# Patient Record
Sex: Female | Born: 2000 | Race: White | Hispanic: No | Marital: Single | Smoking: Never smoker
Health system: Southern US, Community
[De-identification: ages and names within clinical notes are randomized; demographics above are authoritative.]

## PROBLEM LIST (undated history)

## (undated) DIAGNOSIS — Q677 Pectus carinatum: Secondary | ICD-10-CM

## (undated) HISTORY — DX: Pectus carinatum: Q67.7

---
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
## 2022-10-18 IMAGING — US US PELVIS COMPLETE
1 series · 14 of 25 positions shown · non-contrast
Comparison: None

CLINICAL DATA: Polycystic ovarian syndrome, LMP 12/03/2020

EXAM:
TRANSABDOMINAL ULTRASOUND OF PELVIS
TECHNIQUE: Transabdominal ultrasound examination of the pelvis was performed
including evaluation of the uterus, ovaries, adnexal regions, and
pelvic cul-de-sac.

[Series 1: us pelvis complete · 0.14mm/px · 14 of 36 slices shown]
[im 1/36]
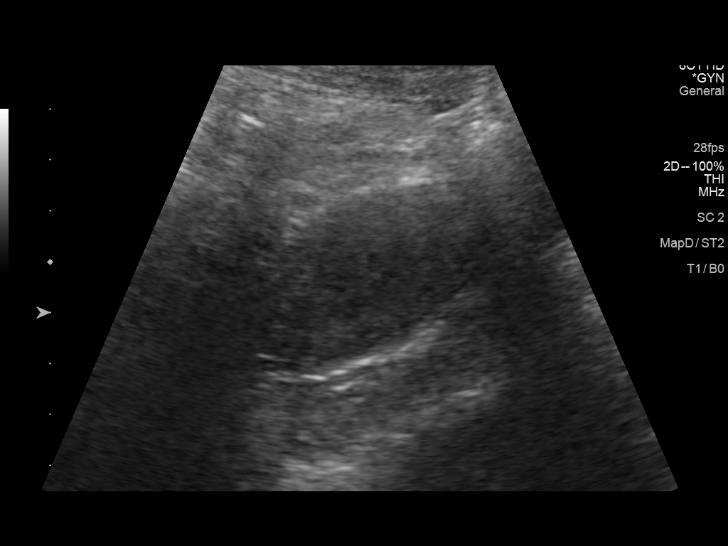
[im 3/36]
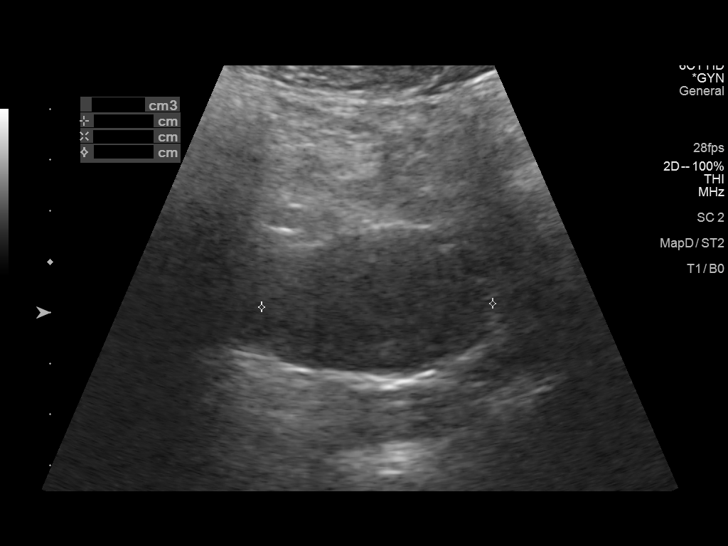
[im 6/36]
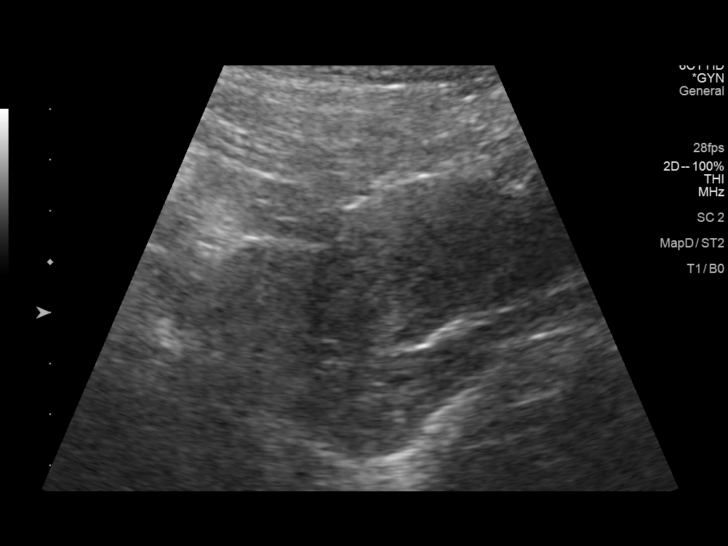
[im 9/36]
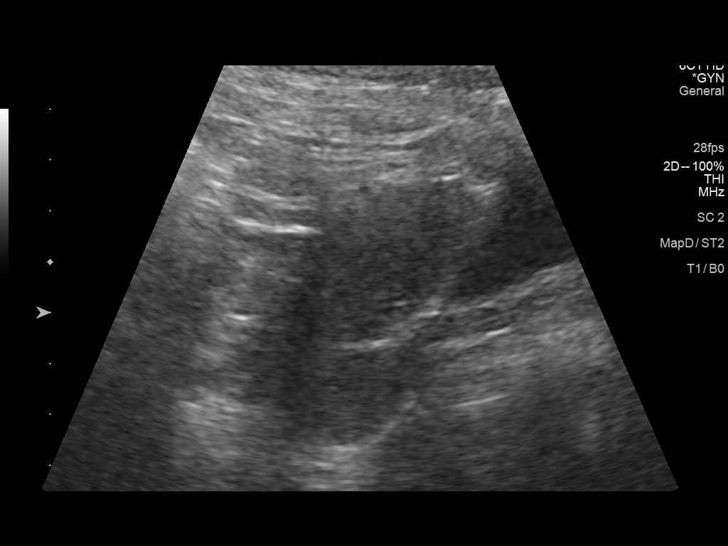
[im 12/36]
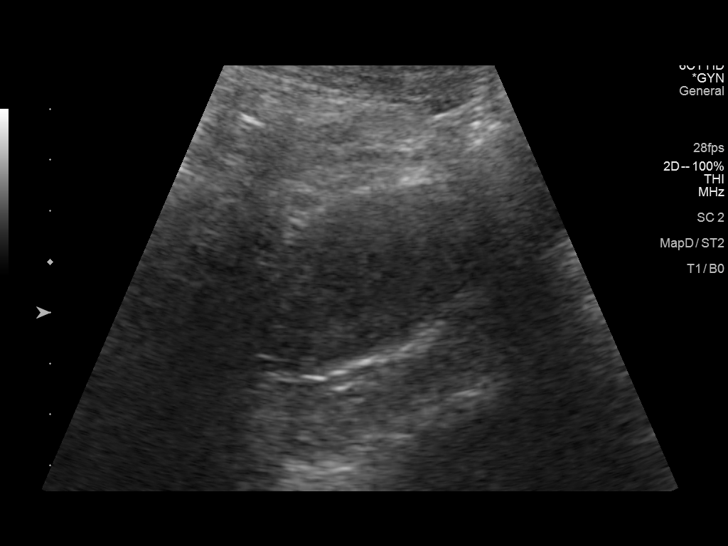
[im 14/36]
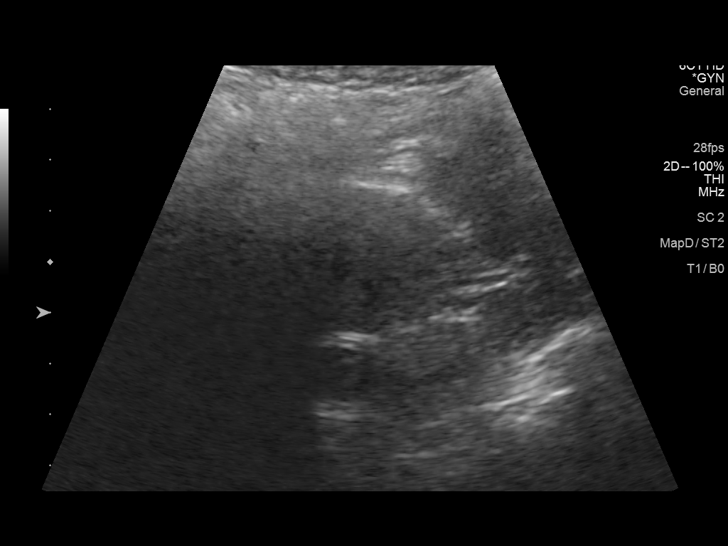
[im 17/36]
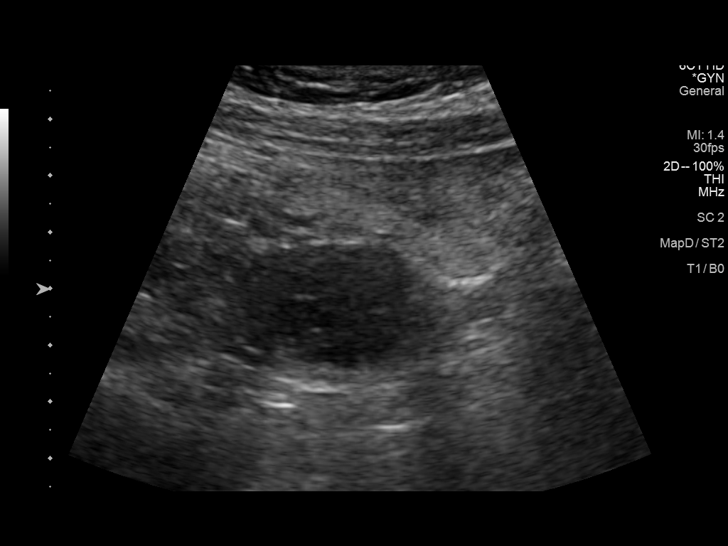
[im 19/36]
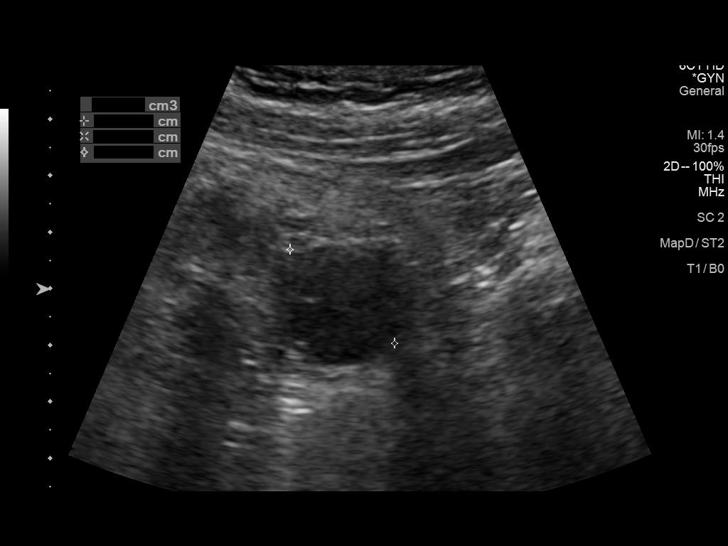
[im 22/36]
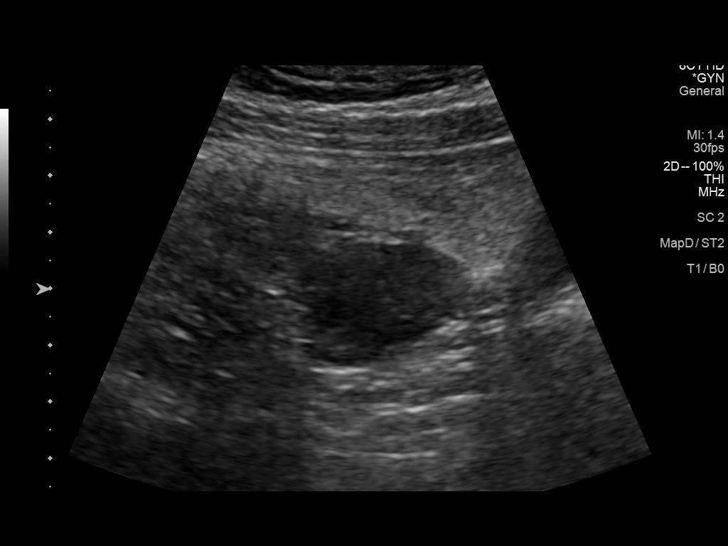
[im 24/36]
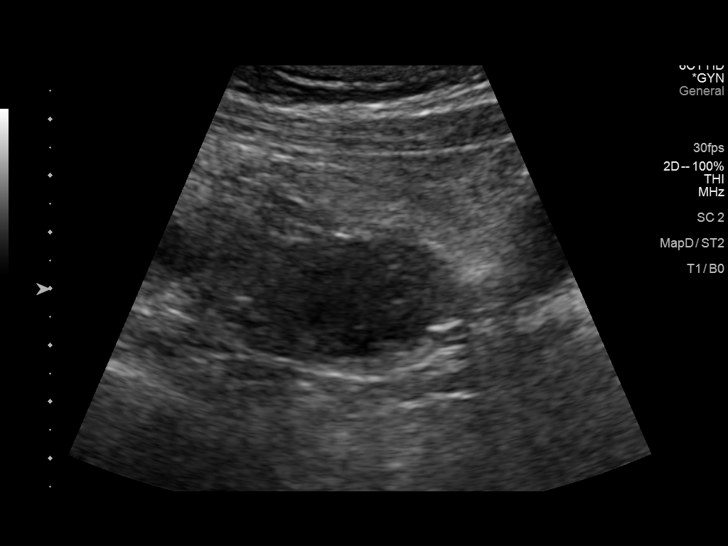
[im 27/36]
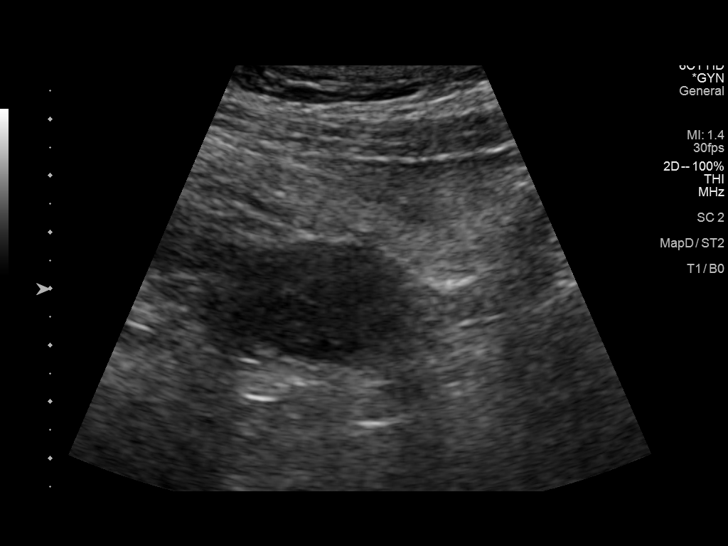
[im 30/36]
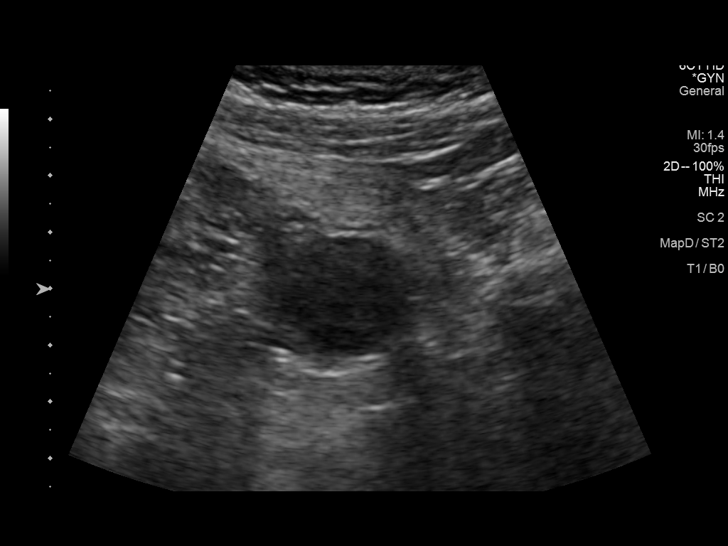
[im 33/36]
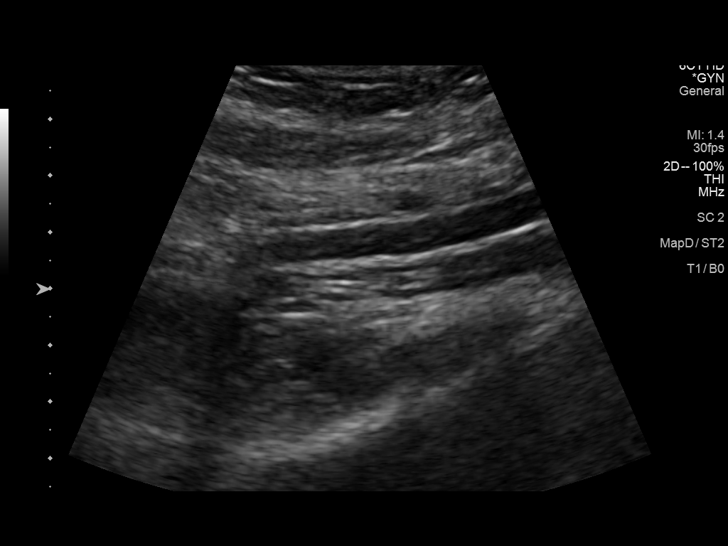
[im 36/36]
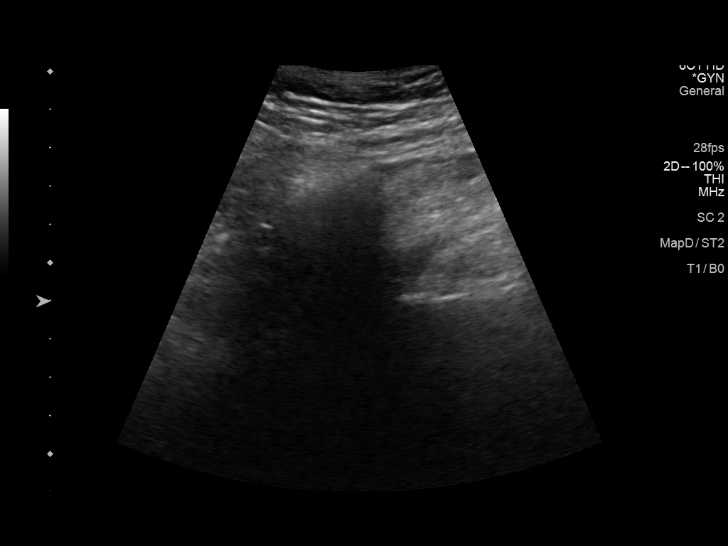

[14 of 25 positions shown; findings below may reference images not displayed]

FINDINGS: Uterus

Measurements: 7.0 x 3.1 x 4.5 cm = volume: 52 mL. Normal morphology
without mass

Endometrium

Thickness: 6 mm. Suboptimally visualized. No gross endometrial fluid
or mass.

Right ovary

Not visualized, likely obscured by bowel

Left ovary

Measurements: 3.8 x 2.2 x 2.5 cm = volume: 11.0 mL. Normal
morphology without mass

Other findings:  No free pelvic fluid.  No adnexal masses.
IMPRESSION: Nonvisualization of RIGHT ovary.

Otherwise negative exam.

## 2023-02-01 DIAGNOSIS — D649 Anemia, unspecified: Secondary | ICD-10-CM | POA: Diagnosis not present

## 2023-06-28 ENCOUNTER — Telehealth: Payer: Self-pay | Admitting: Adult Health

## 2023-06-28 NOTE — Telephone Encounter (Signed)
Lmom for pt to sch cpe or office visit It has been over a yr since pt was  last seen

## 2023-09-15 DIAGNOSIS — F322 Major depressive disorder, single episode, severe without psychotic features: Secondary | ICD-10-CM | POA: Diagnosis not present

## 2023-09-15 DIAGNOSIS — F422 Mixed obsessional thoughts and acts: Secondary | ICD-10-CM | POA: Diagnosis not present

## 2023-09-15 DIAGNOSIS — F902 Attention-deficit hyperactivity disorder, combined type: Secondary | ICD-10-CM | POA: Diagnosis not present

## 2023-09-15 DIAGNOSIS — F411 Generalized anxiety disorder: Secondary | ICD-10-CM | POA: Diagnosis not present

## 2023-11-03 DIAGNOSIS — F322 Major depressive disorder, single episode, severe without psychotic features: Secondary | ICD-10-CM | POA: Diagnosis not present

## 2023-11-03 DIAGNOSIS — F422 Mixed obsessional thoughts and acts: Secondary | ICD-10-CM | POA: Diagnosis not present

## 2023-11-03 DIAGNOSIS — F902 Attention-deficit hyperactivity disorder, combined type: Secondary | ICD-10-CM | POA: Diagnosis not present

## 2023-11-03 DIAGNOSIS — F411 Generalized anxiety disorder: Secondary | ICD-10-CM | POA: Diagnosis not present

## 2023-12-16 DIAGNOSIS — F422 Mixed obsessional thoughts and acts: Secondary | ICD-10-CM | POA: Diagnosis not present

## 2023-12-16 DIAGNOSIS — F322 Major depressive disorder, single episode, severe without psychotic features: Secondary | ICD-10-CM | POA: Diagnosis not present

## 2023-12-16 DIAGNOSIS — F902 Attention-deficit hyperactivity disorder, combined type: Secondary | ICD-10-CM | POA: Diagnosis not present

## 2023-12-16 DIAGNOSIS — F411 Generalized anxiety disorder: Secondary | ICD-10-CM | POA: Diagnosis not present

## 2024-01-21 DIAGNOSIS — Z113 Encounter for screening for infections with a predominantly sexual mode of transmission: Secondary | ICD-10-CM | POA: Diagnosis not present

## 2024-02-08 DIAGNOSIS — Z713 Dietary counseling and surveillance: Secondary | ICD-10-CM | POA: Diagnosis not present

## 2024-02-22 DIAGNOSIS — Z713 Dietary counseling and surveillance: Secondary | ICD-10-CM | POA: Diagnosis not present

## 2024-03-02 DIAGNOSIS — Z713 Dietary counseling and surveillance: Secondary | ICD-10-CM | POA: Diagnosis not present

## 2024-03-14 DIAGNOSIS — Z713 Dietary counseling and surveillance: Secondary | ICD-10-CM | POA: Diagnosis not present
